# Patient Record
Sex: Male | Born: 1995 | Race: Black or African American | Hispanic: No | Marital: Single | Smoking: Never smoker
Health system: Southern US, Community
[De-identification: ages and names within clinical notes are randomized; demographics above are authoritative.]

## PROBLEM LIST (undated history)

## (undated) DIAGNOSIS — J302 Other seasonal allergic rhinitis: Secondary | ICD-10-CM

## (undated) HISTORY — PX: WISDOM TOOTH EXTRACTION: SHX21

## (undated) HISTORY — PX: KNEE SURGERY: SHX244

---

## 2017-05-01 ENCOUNTER — Emergency Department (HOSPITAL_COMMUNITY): Payer: Federal, State, Local not specified - PPO

## 2017-05-01 ENCOUNTER — Encounter (HOSPITAL_COMMUNITY): Payer: Self-pay | Admitting: Family Medicine

## 2017-05-01 ENCOUNTER — Emergency Department (HOSPITAL_COMMUNITY)
Admission: EM | Admit: 2017-05-01 | Discharge: 2017-05-01 | Disposition: A | Discharge status: Home / Self Care | Payer: Federal, State, Local not specified - PPO | Attending: Emergency Medicine | Admitting: Emergency Medicine

## 2017-05-01 DIAGNOSIS — Z23 Encounter for immunization: Secondary | ICD-10-CM | POA: Diagnosis not present

## 2017-05-01 DIAGNOSIS — S91022A Laceration with foreign body, left ankle, initial encounter: Secondary | ICD-10-CM | POA: Insufficient documentation

## 2017-05-01 DIAGNOSIS — S51821A Laceration with foreign body of right forearm, initial encounter: Secondary | ICD-10-CM | POA: Insufficient documentation

## 2017-05-01 DIAGNOSIS — Y929 Unspecified place or not applicable: Secondary | ICD-10-CM | POA: Diagnosis not present

## 2017-05-01 DIAGNOSIS — Y939 Activity, unspecified: Secondary | ICD-10-CM | POA: Insufficient documentation

## 2017-05-01 DIAGNOSIS — Y999 Unspecified external cause status: Secondary | ICD-10-CM | POA: Diagnosis not present

## 2017-05-01 HISTORY — DX: Other seasonal allergic rhinitis: J30.2

## 2017-05-01 MED ORDER — BACITRACIN ZINC 500 UNIT/GM EX OINT
TOPICAL_OINTMENT | CUTANEOUS | Status: AC
  Administered 2017-05-01: 1
  Filled 2017-05-01: qty 3.6

## 2017-05-01 MED ORDER — LIDOCAINE-EPINEPHRINE (PF) 2 %-1:200000 IJ SOLN
10.0000 mL | Freq: Once | INTRAMUSCULAR | Status: AC
  Administered 2017-05-01: 10 mL
  Filled 2017-05-01: qty 20

## 2017-05-01 MED ORDER — BACITRACIN ZINC 500 UNIT/GM EX OINT
TOPICAL_OINTMENT | CUTANEOUS | Status: AC
  Filled 2017-05-01: qty 0.9

## 2017-05-01 MED ORDER — TETANUS-DIPHTH-ACELL PERTUSSIS 5-2.5-18.5 LF-MCG/0.5 IM SUSP
0.5000 mL | Freq: Once | INTRAMUSCULAR | Status: AC
  Administered 2017-05-01: 0.5 mL via INTRAMUSCULAR
  Filled 2017-05-01: qty 0.5
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
## 2017-05-01 NOTE — Discharge Instructions (Signed)
Please read instructions below.  Keep your wound clean, dry, and covered. You can let water and soap run over your wound in the shower after 24 hours. Do not soak in the bathtub or put ointment on your wound. Follow up with your primary care or urgent care for wound recheck in 7-10 days.  Return to the ER for fever, pus draining from wound, redness, new numbness or tingling in your arms or legs, inability to urinate, inability to hold your bowels, or weakness in your extremities.
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
## 2017-05-02 ENCOUNTER — Emergency Department (HOSPITAL_COMMUNITY): Payer: Federal, State, Local not specified - PPO

## 2017-05-02 ENCOUNTER — Emergency Department (HOSPITAL_COMMUNITY)
Admission: EM | Admit: 2017-05-02 | Discharge: 2017-05-02 | Disposition: A | Discharge status: Home / Self Care | Payer: Federal, State, Local not specified - PPO | Attending: Emergency Medicine | Admitting: Emergency Medicine

## 2017-05-02 ENCOUNTER — Encounter (HOSPITAL_COMMUNITY): Payer: Self-pay | Admitting: Emergency Medicine

## 2017-05-02 DIAGNOSIS — R110 Nausea: Secondary | ICD-10-CM

## 2017-05-02 DIAGNOSIS — R51 Headache: Secondary | ICD-10-CM | POA: Diagnosis present

## 2017-05-02 DIAGNOSIS — Z79899 Other long term (current) drug therapy: Secondary | ICD-10-CM | POA: Diagnosis not present

## 2017-05-02 DIAGNOSIS — R42 Dizziness and giddiness: Secondary | ICD-10-CM | POA: Insufficient documentation

## 2017-05-02 DIAGNOSIS — R112 Nausea with vomiting, unspecified: Secondary | ICD-10-CM | POA: Insufficient documentation

## 2017-05-02 MED ORDER — ONDANSETRON 4 MG PO TBDP
4.0000 mg | ORAL_TABLET | Freq: Once | ORAL | Status: AC
  Administered 2017-05-02: 4 mg via ORAL
  Filled 2017-05-02: qty 1

## 2017-05-02 MED ORDER — ONDANSETRON 4 MG PO TBDP: 4.0000 mg | ORAL_TABLET | Freq: Three times a day (TID) | ORAL | 0 refills | Status: AC | PRN

## 2017-05-02 NOTE — Discharge Instructions (Signed)
Take Zofran as needed for nausea. Continue home medications as previously prescribed. Take ibuprofen or Tylenol as needed for musculoskeletal pain. Return to ED for worsening pain, blurry vision, additional injury, trouble walking, numbness or weakness.
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
## 2017-05-08 ENCOUNTER — Ambulatory Visit (HOSPITAL_COMMUNITY)
Admission: EM | Admit: 2017-05-08 | Discharge: 2017-05-08 | Disposition: A | Discharge status: Home / Self Care | Payer: Federal, State, Local not specified - PPO

## 2017-05-08 ENCOUNTER — Encounter (HOSPITAL_COMMUNITY): Payer: Self-pay | Admitting: Family Medicine

## 2017-05-08 NOTE — ED Triage Notes (Signed)
Pt here for suture removal. 3 sutures to left ankle removed.

## 2019-02-27 IMAGING — CR DG ANKLE COMPLETE 3+V*L*
3 series · 3 of 3 positions shown · non-contrast
Comparison: Left ankle radiograph 05/01/2017 at [DATE] p.m.

CLINICAL DATA: Foreign body removal

EXAM:
LEFT ANKLE COMPLETE - 3+ VIEW

[x ankle ap left]
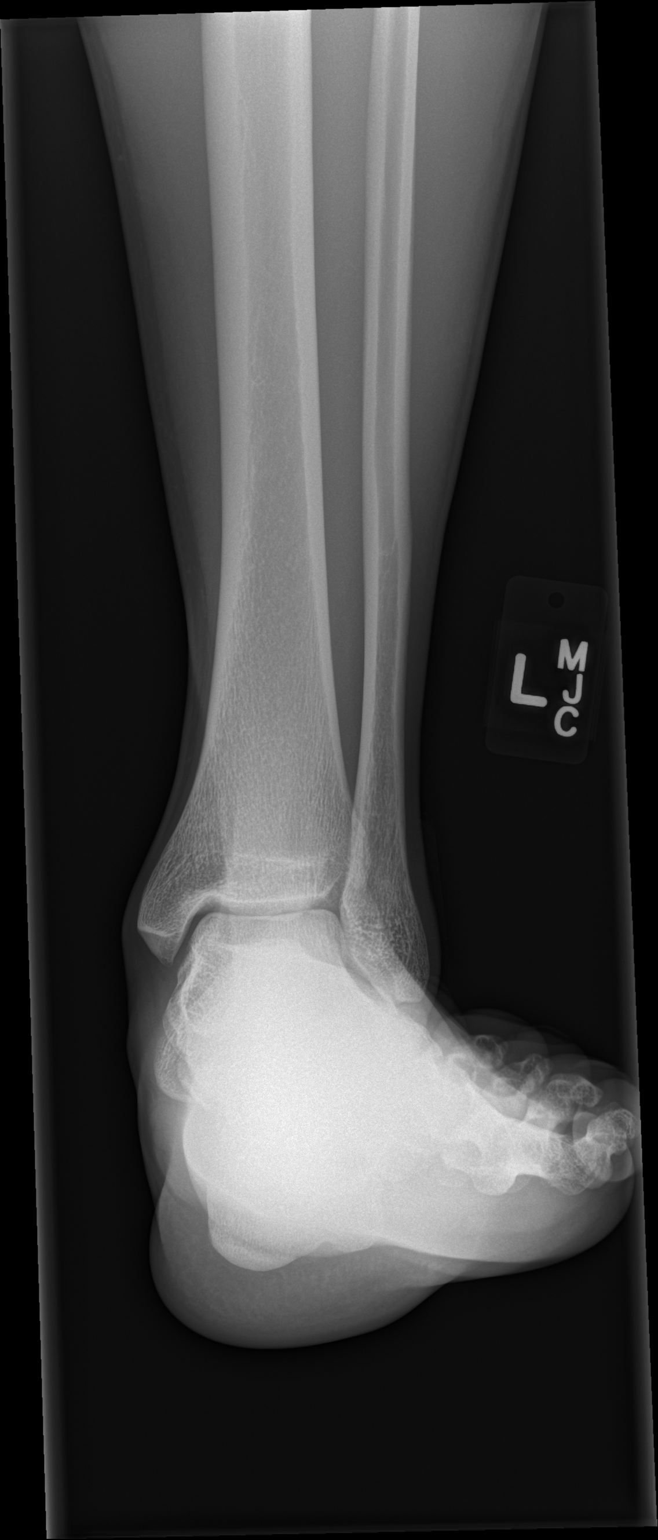

[x ankle obl left]
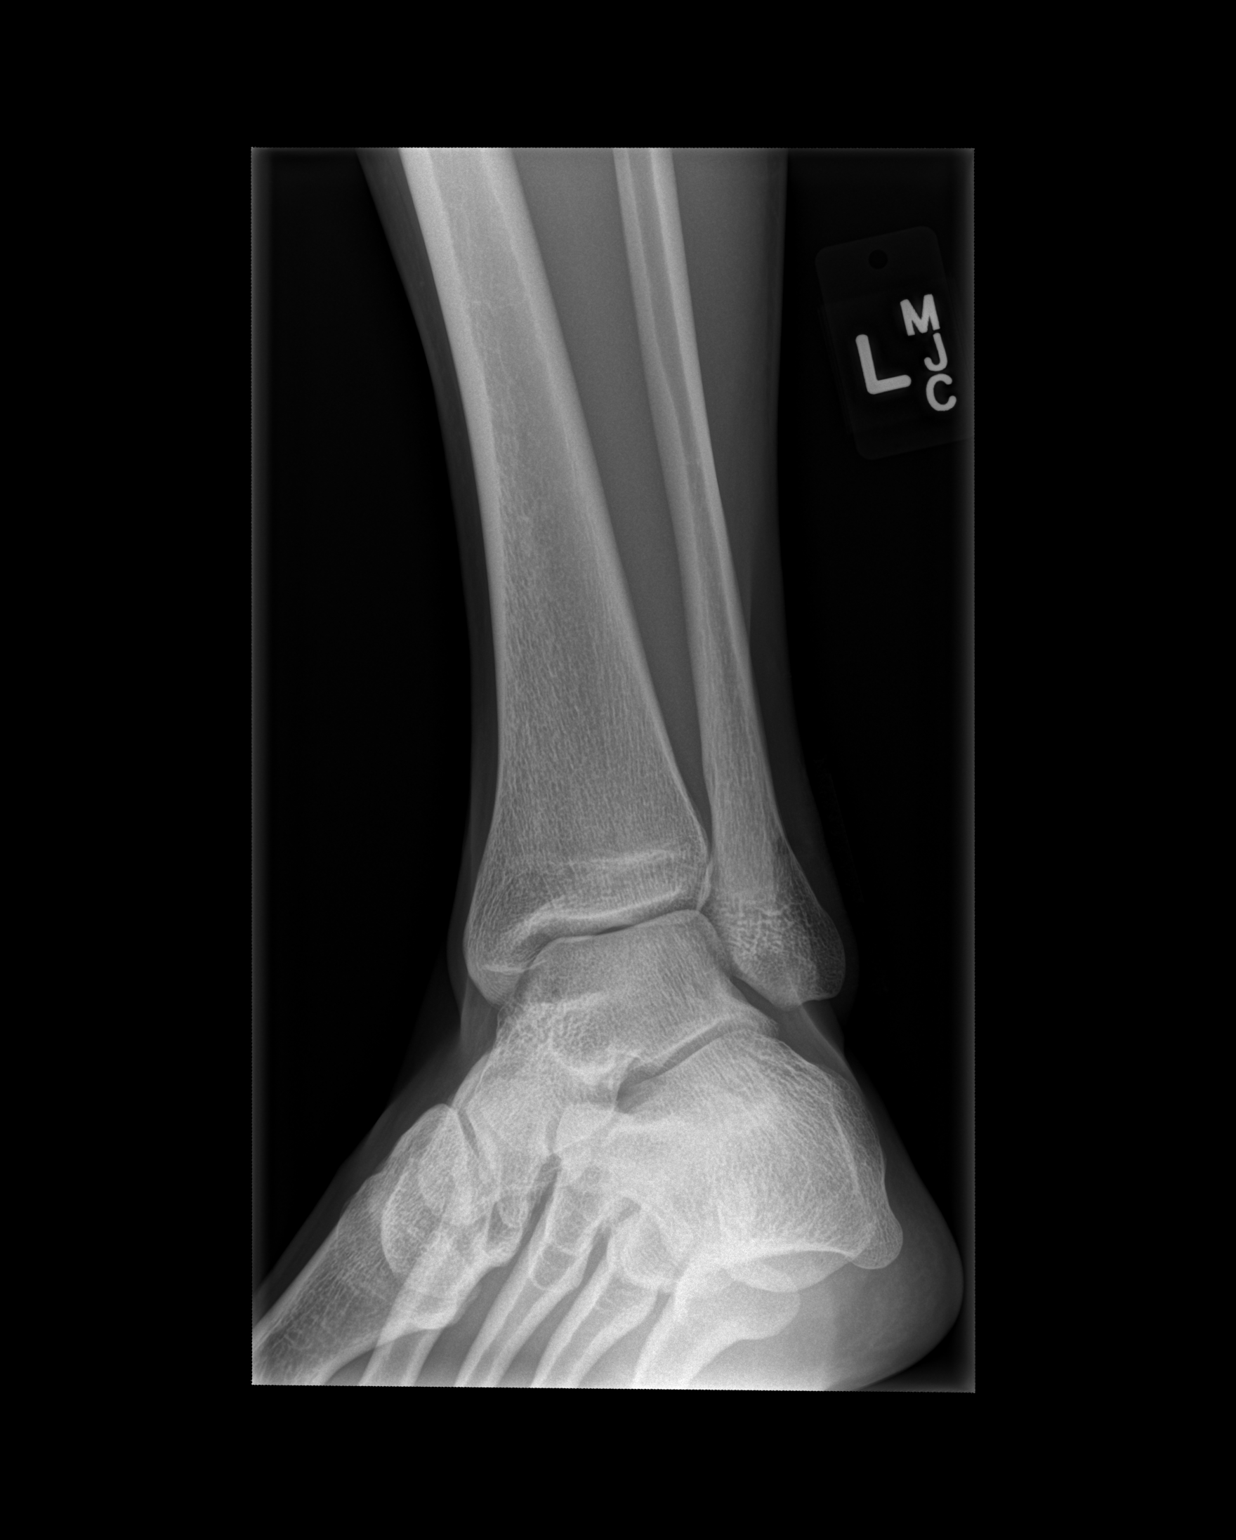

[x ankle lat left]
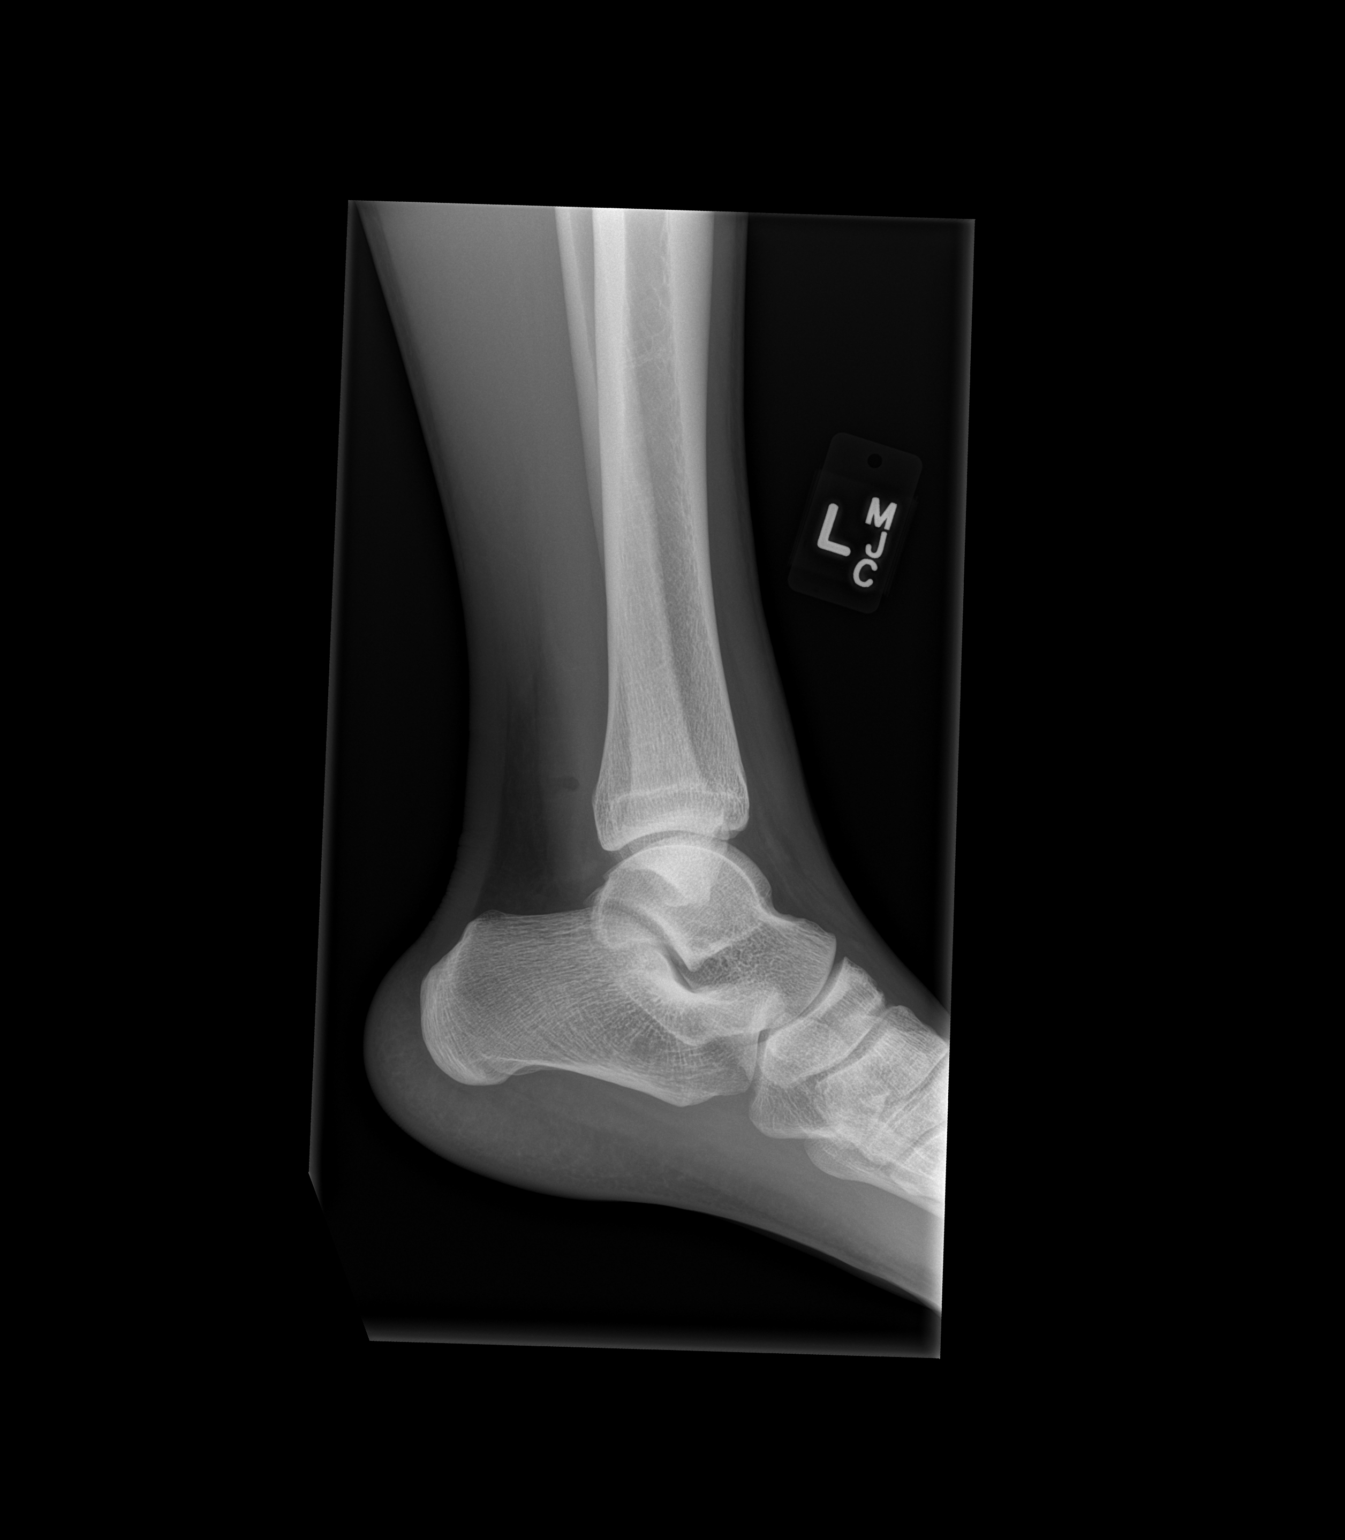

[3 of 3 positions shown; findings below may reference images not displayed]

FINDINGS: The previously seen radiopaque foreign body in the posterior ankle
soft tissues has been removed. No residual. No acute fracture or
dislocation of the left ankle.
IMPRESSION: Removal of radiopaque foreign body from the posterior ankle soft
tissues without residual.
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
